# Patient Record
Sex: Female | Born: 1990 | Race: White | Hispanic: No | Marital: Single | Smoking: Never smoker
Health system: Southern US, Community
[De-identification: ages and names within clinical notes are randomized; demographics above are authoritative.]

## PROBLEM LIST (undated history)

## (undated) DIAGNOSIS — F419 Anxiety disorder, unspecified: Secondary | ICD-10-CM

## (undated) HISTORY — PX: PELVIS CLOSED REDUCTION: SHX1023

---

## 2013-07-31 ENCOUNTER — Ambulatory Visit: Payer: Self-pay | Admitting: Physician Assistant

## 2014-07-10 ENCOUNTER — Encounter: Payer: Self-pay | Admitting: Emergency Medicine

## 2014-07-10 ENCOUNTER — Ambulatory Visit
Admission: EM | Admit: 2014-07-10 | Discharge: 2014-07-10 | Disposition: A | Discharge status: Home / Self Care | Payer: BLUE CROSS/BLUE SHIELD | Attending: Family Medicine | Admitting: Family Medicine

## 2014-07-10 DIAGNOSIS — J029 Acute pharyngitis, unspecified: Secondary | ICD-10-CM | POA: Diagnosis present

## 2014-07-10 DIAGNOSIS — J020 Streptococcal pharyngitis: Secondary | ICD-10-CM | POA: Diagnosis not present

## 2014-07-10 HISTORY — DX: Anxiety disorder, unspecified: F41.9

## 2014-07-10 LAB — RAPID STREP SCREEN (MED CTR MEBANE ONLY): Streptococcus, Group A Screen (Direct): POSITIVE — AB

## 2014-07-10 MED ORDER — AMOXICILLIN 500 MG PO CAPS: 500.0000 mg | ORAL_CAPSULE | Freq: Three times a day (TID) | ORAL | Status: DC

## 2014-07-10 NOTE — ED Notes (Signed)
Sore throat  For 5 days

## 2014-07-10 NOTE — ED Provider Notes (Signed)
Patient presents today with symptoms of sore throat for the last 5 days. She denies any high fever, abdominal pain, severe headache, rash, chest pain, shortness of breath, nasal congestion, cough. She admits her last menstrual period being about 2 weeks ago.  ROS: Negative except mentioned above. Vitals as per Epic.  GENERAL: NAD HEENT: moderate pharyngeal erythema, no exudate, no erythema of TMs, no cervical LAD RESP: CTA B CARD: RRR NEURO: CN II-XII groslly intact   A/P: Strep pharyngitis-rapid strep was positive in the office today, will treat with amoxicillin, encourage patient is a Tylenol and/or Motrin for pain/fever, if symptoms do persist or worsen patient is to seek medical attention. Discussed using condoms if sexually active with men while on antibiotics. Discouraged patient from working today and tomorrow.   Jolene ProvostKirtida Kiyara Bouffard, MD 07/10/14 1149

## 2015-06-24 ENCOUNTER — Ambulatory Visit
Admission: EM | Admit: 2015-06-24 | Discharge: 2015-06-24 | Disposition: A | Discharge status: Home / Self Care | Payer: BC Managed Care – PPO | Attending: Family Medicine | Admitting: Family Medicine

## 2015-06-24 ENCOUNTER — Encounter: Payer: Self-pay | Admitting: *Deleted

## 2015-06-24 DIAGNOSIS — S61219A Laceration without foreign body of unspecified finger without damage to nail, initial encounter: Secondary | ICD-10-CM

## 2015-06-24 MED ORDER — NAPROXEN 500 MG PO TABS: 500.0000 mg | ORAL_TABLET | Freq: Two times a day (BID) | ORAL | Status: DC

## 2015-06-24 MED ORDER — TETANUS-DIPHTH-ACELL PERTUSSIS 5-2.5-18.5 LF-MCG/0.5 IM SUSP
0.5000 mL | Freq: Once | INTRAMUSCULAR | Status: AC
  Administered 2015-06-24: 0.5 mL via INTRAMUSCULAR

## 2015-06-24 MED ORDER — SULFAMETHOXAZOLE-TRIMETHOPRIM 800-160 MG PO TABS: 1.0000 | ORAL_TABLET | Freq: Two times a day (BID) | ORAL | Status: DC

## 2015-06-24 MED ORDER — TETANUS-DIPHTH-ACELL PERTUSSIS 5-2.5-18.5 LF-MCG/0.5 IM SUSP: 0.5000 mL | Freq: Once | INTRAMUSCULAR | Status: DC

## 2015-06-24 MED ORDER — MUPIROCIN 2 % EX OINT: 1.0000 "application " | TOPICAL_OINTMENT | Freq: Three times a day (TID) | CUTANEOUS | Status: DC

## 2015-06-24 NOTE — ED Provider Notes (Signed)
CSN: [id number]     Arrival date & time 06/24/15  1414 History   First MD Initiated Contact with Patient 06/24/15 1523     Chief Complaint  Patient presents with  . Extremity Laceration   (Consider location/radiation/quality/duration/timing/severity/associated sxs/prior Treatment) HPI  25 year old female whoWas assisting a friend repairing a motorcycle 4 days ago when she inadvertently cut the tip of her left index finger on a sharp piece of metal. She did not seek medical attention and has instead been applying antibiotic ointment at first and then keeping it dry with skin liquid bandage. He worked as a Leisure centre manager and has been attempting to keep it dry using fingercots and the skin adhesive. Despite this the wound has been symptomatic with discomfort swelling and some redness. The laceration is approximately 5 millimeters at the radial distal tip not involving the fingernail. She's not been running fever or chills and has had no significant drainage from the wound.      Past Medical History  Diagnosis Date  . Anxiety    Past Surgical History  Procedure Laterality Date  . Pelvis closed reduction     Family History  Problem Relation Age of Onset  . Rheum arthritis Neg Hx   . Osteoarthritis Neg Hx   . Asthma Neg Hx   . Cancer Neg Hx   . Diabetes Neg Hx   . Heart failure Neg Hx   . Hyperlipidemia Neg Hx   . Hypertension Neg Hx   . Migraines Neg Hx   . Rashes / Skin problems Neg Hx   . Seizures Neg Hx   . Stroke Neg Hx   . Thyroid disease Neg Hx    Social History  Substance Use Topics  . Smoking status: Never Smoker   . Smokeless tobacco: Never Used  . Alcohol Use: No   OB History    No data available     Review of Systems  Constitutional: Positive for activity change. Negative for fever, chills and fatigue.  Skin: Positive for wound.  All other systems reviewed and are negative.   Allergies  Review of patient's allergies indicates no known allergies.  Home  Medications   Prior to Admission medications   Medication Sig Start Date End Date Taking? Authorizing Provider  amoxicillin (AMOXIL) 500 MG capsule Take 1 capsule (500 mg total) by mouth 3 (three) times daily. 07/10/14   Jolene Provost, MD  escitalopram (LEXAPRO) 10 MG tablet Take 10 mg by mouth daily.    Historical Provider, MD  mupirocin ointment (BACTROBAN) 2 % Apply 1 application topically 3 (three) times daily. 06/24/15   Lutricia Feil, PA-C  naproxen (NAPROSYN) 500 MG tablet Take 1 tablet (500 mg total) by mouth 2 (two) times daily with a meal. 06/24/15   Lutricia Feil, PA-C  sulfamethoxazole-trimethoprim (BACTRIM DS,SEPTRA DS) 800-160 MG tablet Take 1 tablet by mouth 2 (two) times daily. 06/24/15   Lutricia Feil, PA-C   Meds Ordered and Administered this Visit   Medications  Tdap (BOOSTRIX) injection 0.5 mL (0.5 mLs Intramuscular Given 06/24/15 1543)    BP 106/69 mmHg  Pulse 66  Temp(Src) 98.2 F (36.8 C) (Oral)  Resp 18  Ht  (1.727 m)  Wt 110 lb (49.896 kg)  BMI 16.73 kg/m2  SpO2 100%  LMP 06/15/2015 No data found.   Physical Exam  Constitutional: She is oriented to person, place, and time. She appears well-developed and well-nourished. No distress.  HENT:  Head: Normocephalic and atraumatic.  Eyes:  Conjunctivae are normal. Pupils are equal, round, and reactive to light.  Neck: Normal range of motion. Neck supple.  Musculoskeletal: She exhibits edema and tenderness.  Examination of the left nondominant hand of the index finger shows a half centimeter laceration with dried blood over the radial aspect of the distal tip not involving the fingernail. FDS and FDP are strong and good pull-through. There is mild hip esthesia at the tip. There is no current drainage.  Neurological: She is alert and oriented to person, place, and time.  Skin: Skin is warm and dry. She is not diaphoretic.  Psychiatric: She has a normal mood and affect. Her behavior is normal. Judgment and  thought content normal.  Nursing note and vitals reviewed.   ED Course  Procedures (including critical care time)  Labs Review Labs Reviewed - No data to display  Imaging Review No results found.   Visual Acuity Review  Right Eye Distance:   Left Eye Distance:   Bilateral Distance:    Right Eye Near:   Left Eye Near:    Bilateral Near:      Medications  Tdap (BOOSTRIX) injection 0.5 mL (0.5 mLs Intramuscular Given 06/24/15 1543)     MDM   1. Laceration of finger with delay in treatment, initial encounter    New Prescriptions   MUPIROCIN OINTMENT (BACTROBAN) 2 %    Apply 1 application topically 3 (three) times daily.   NAPROXEN (NAPROSYN) 500 MG TABLET    Take 1 tablet (500 mg total) by mouth 2 (two) times daily with a meal.   SULFAMETHOXAZOLE-TRIMETHOPRIM (BACTRIM DS,SEPTRA DS) 800-160 MG TABLET    Take 1 tablet by mouth 2 (two) times daily.  Plan: 1. Test/x-ray results and diagnosis reviewed with patient 2. rx as per orders; risks, benefits, potential side effects reviewed with patient 3. Recommend supportive treatment with Keeping it clean and dry. 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 She should not do serial the wound now with new skin that Take Pl., Bactroban on the wound 3 times a day with a washing program. I reviewed with her signs and symptoms of infection and should these occur she'll return immediately. Placed her on empiricSeptra DS for 5 days and given her Naprosyn for pain as necessary. 4. F/u prn if symptoms worsen or don't improve     Lutricia FeilWilliam P Roemer, PA-C 06/24/15 1549

## 2015-06-24 NOTE — ED Notes (Signed)
Patient was assisting friend with working on their motorcycle 4 days ago when she cut the tip of her left index finger. Patient has applied antibiotic ointment and liquid bandage.

## 2015-06-24 NOTE — Discharge Instructions (Signed)
Nonsutured Laceration Care °A laceration is a cut that goes through all layers of the skin and extends into the tissue that is right under the skin. This type of cut is usually stitched up (sutured) or closed with tape (adhesive strips) or skin glue shortly after the injury happens. °However, if the wound is dirty or if several hours pass before medical treatment is provided, it is likely that germs (bacteria) will enter the wound. Closing a laceration after bacteria have entered it increases the risk of infection. In these cases, your health care provider may leave the laceration open (nonsutured) and cover it with a bandage. This type of treatment helps prevent infection and allows the wound to heal from the deepest layer of tissue damage up to the surface. °An open fracture is a type of injury that may involve nonsutured lacerations. An open fracture is a break in a bone that happens along with one or more lacerations through the skin that is near the fracture site. °HOW TO CARE FOR YOUR NONSUTURED LACERATION °· Take or apply over-the-counter and prescription medicines only as told by your health care provider. °· If you were prescribed an antibiotic medicine, take or apply it as told by your health care provider. Do not stop using the antibiotic even if your condition improves. °· Clean the wound one time each day or as told by your health care provider. °¨ Wash the wound with mild soap and water. °¨ Rinse the wound with water to remove all soap. °¨ Pat your wound dry with a clean towel. Do not rub the wound. °· Do not inject anything into the wound unless your health care provider told you to. °· Change any bandages (dressings) as told by your health care provider. This includes changing the dressing if it gets wet, dirty, or starts to smell bad. °· Keep the dressing dry until your health care provider says it can be removed. Do not take baths, swim, or do anything that puts your wound underwater until your  health care provider approves. °· Raise (elevate) the injured area above the level of your heart while you are sitting or lying down, if possible. °· Do not scratch or pick at the wound. °· Check your wound every day for signs of infection. Watch for: °¨ Redness, swelling, or pain. °¨ Fluid, blood, or pus. °· Keep all follow-up visits as told by your health care provider. This is important. °SEEK MEDICAL CARE IF: °· You received a tetanus and shot and you have swelling, severe pain, redness, or bleeding at the injection site.   °· You have a fever. °· Your pain is not controlled with medicine. °· You have increased redness, swelling, or pain at the site of your wound. °· You have fluid, blood, or pus coming from your wound. °· You notice a bad smell coming from your wound or your dressing. °· You notice something coming out of the wound, such as wood or glass. °· You notice a change in the color of your skin near your wound. °· You develop a new rash. °· You need to change the dressing frequently due to fluid, blood, or pus draining from the wound. °· You develop numbness around your wound. °SEEK IMMEDIATE MEDICAL CARE IF: °· Your pain suddenly increases and is severe. °· You develop severe swelling around the wound. °· The wound is on your hand or foot and you cannot properly move a finger or toe. °· The wound is on your hand or   foot and you notice that your fingers or toes look pale or bluish. °· You have a red streak going away from your wound. °  °This information is not intended to replace advice given to you by your health care provider. Make sure you discuss any questions you have with your health care provider. °  °Document Released: 11/21/2005 Document Revised: 05/10/2014 Document Reviewed: 12/20/2013 °[employer]vier Interactive Patient Education ©2016 [employer] ° °Wound Care °Taking care of your wound properly can help to prevent pain and infection. It can also help your wound to heal more quickly.  °HOW TO  CARE FOR YOUR WOUND  °· Take or apply over-the-counter and prescription medicines only as told by your health care provider. °· If you were prescribed antibiotic medicine, take or apply it as told by your health care provider. Do not stop using the antibiotic even if your condition improves. °· Clean the wound each day or as told by your health care provider. °¨ Wash the wound with mild soap and water. °¨ Rinse the wound with water to remove all soap. °¨ Pat the wound dry with a clean towel. Do not rub it. °· There are many different ways to close and cover a wound. For example, a wound can be covered with stitches (sutures), skin glue, or adhesive strips. Follow instructions from your health care provider about: °¨ How to take care of your wound. °¨ When and how you should change your bandage (dressing). °¨ When you should remove your dressing. °¨ Removing whatever was used to close your wound. °· Check your wound every day for signs of infection. Watch for: °¨ Redness, swelling, or pain. °¨ Fluid, blood, or pus. °· Keep the dressing dry until your health care provider says it can be removed. Do not take baths, swim, use a hot tub, or do anything that would put your wound underwater until your health care provider approves. °· Raise (elevate) the injured area above the level of your heart while you are sitting or lying down. °· Do not scratch or pick at the wound. °· Keep all follow-up visits as told by your health care provider. This is important. °SEEK MEDICAL CARE IF: °· You received a tetanus shot and you have swelling, severe pain, redness, or bleeding at the injection site. °· You have a fever. °· Your pain is not controlled with medicine. °· You have increased redness, swelling, or pain at the site of your wound. °· You have fluid, blood, or pus coming from your wound. °· You notice a bad smell coming from your wound or your dressing. °SEEK IMMEDIATE MEDICAL CARE IF: °· You have a red streak going away from  your wound. °  °This information is not intended to replace advice given to you by your health care provider. Make sure you discuss any questions you have with your health care provider. °  °Document Released: 10/03/2007 Document Revised: 05/10/2014 Document Reviewed: 12/20/2013 °[employer] Interactive Patient Education ©2016 [employer] ° °

## 2015-08-27 ENCOUNTER — Ambulatory Visit
Admission: EM | Admit: 2015-08-27 | Discharge: 2015-08-27 | Disposition: A | Discharge status: Home / Self Care | Payer: BC Managed Care – PPO | Attending: Family Medicine | Admitting: Family Medicine

## 2015-08-27 DIAGNOSIS — H6593 Unspecified nonsuppurative otitis media, bilateral: Secondary | ICD-10-CM

## 2015-08-27 DIAGNOSIS — J329 Chronic sinusitis, unspecified: Secondary | ICD-10-CM | POA: Insufficient documentation

## 2015-08-27 DIAGNOSIS — J029 Acute pharyngitis, unspecified: Secondary | ICD-10-CM

## 2015-08-27 DIAGNOSIS — J019 Acute sinusitis, unspecified: Secondary | ICD-10-CM | POA: Diagnosis not present

## 2015-08-27 LAB — RAPID STREP SCREEN (MED CTR MEBANE ONLY): Streptococcus, Group A Screen (Direct): NEGATIVE

## 2015-08-27 MED ORDER — CETIRIZINE HCL 10 MG PO CAPS: 10.0000 mg | ORAL_CAPSULE | Freq: Every day | ORAL | Status: DC

## 2015-08-27 MED ORDER — ACETAMINOPHEN 500 MG PO TABS: 1000.0000 mg | ORAL_TABLET | Freq: Four times a day (QID) | ORAL | 0 refills | Status: DC | PRN

## 2015-08-27 MED ORDER — AMOXICILLIN-POT CLAVULANATE 875-125 MG PO TABS: 1.0000 | ORAL_TABLET | Freq: Two times a day (BID) | ORAL | 0 refills | Status: AC

## 2015-08-27 MED ORDER — SALINE SPRAY 0.65 % NA SOLN: 2.0000 | NASAL | 0 refills | Status: DC

## 2015-08-27 MED ORDER — PSEUDOEPHEDRINE HCL 30 MG PO TABS: 30.0000 mg | ORAL_TABLET | Freq: Four times a day (QID) | ORAL | 0 refills | Status: AC | PRN

## 2015-08-27 MED ORDER — IBUPROFEN 800 MG PO TABS: 800.0000 mg | ORAL_TABLET | Freq: Three times a day (TID) | ORAL | 0 refills | Status: DC

## 2015-08-27 MED ORDER — FLUTICASONE PROPIONATE 50 MCG/ACT NA SUSP: 1.0000 | Freq: Two times a day (BID) | NASAL | 0 refills | Status: DC

## 2015-08-27 MED ORDER — FIRST-DUKES MOUTHWASH MT SUSP
30.0000 mL | Freq: Three times a day (TID) | OROMUCOSAL | 0 refills | Status: AC | PRN
Start: 2015-08-27 — End: 2015-09-03

## 2015-08-27 MED ORDER — ONDANSETRON 8 MG PO TBDP: 8.0000 mg | ORAL_TABLET | Freq: Two times a day (BID) | ORAL | 0 refills | Status: DC | PRN

## 2015-08-27 NOTE — ED Provider Notes (Signed)
CSN: [id number]     Arrival date & time 08/27/15  1343 History   First MD Initiated Contact with Patient 08/27/15 1357     Chief Complaint  Patient presents with  . Sore Throat   (Consider location/radiation/quality/duration/timing/severity/associated sxs/prior Treatment) Single caucasian female established patient to this clinic here for evaluation sore throat x 3 days, post nasal drip, vomiting/nausea yesterday, headache, nasal congestion, hoarse voice.  Works in [employer] needs work excuse.  Decreased po intake due to pain.  Has tried benadryl and sudafed without much improvement in symptoms and motrin/tylenol for pain.  Has had 5 cases of strep throat in the previous year per patient  PMHx glasses, seasonal allergies  PSHx pelvis fracture      Past Medical History:  Diagnosis Date  . Anxiety    Past Surgical History:  Procedure Laterality Date  . PELVIS CLOSED REDUCTION     Family History  Problem Relation Age of Onset  . Rheum arthritis Neg Hx   . Osteoarthritis Neg Hx   . Asthma Neg Hx   . Cancer Neg Hx   . Diabetes Neg Hx   . Heart failure Neg Hx   . Hyperlipidemia Neg Hx   . Hypertension Neg Hx   . Migraines Neg Hx   . Rashes / Skin problems Neg Hx   . Seizures Neg Hx   . Stroke Neg Hx   . Thyroid disease Neg Hx    Social History  Substance Use Topics  . Smoking status: Never Smoker  . Smokeless tobacco: Never Used  . Alcohol use No   OB History    No data available     Review of Systems  Constitutional: Positive for activity change, appetite change, chills and fatigue. Negative for diaphoresis, fever and unexpected weight change.  HENT: Positive for congestion, ear pain, postnasal drip, rhinorrhea, sinus pressure, sore throat and voice change. Negative for dental problem, drooling, ear discharge, facial swelling, hearing loss, mouth sores, nosebleeds, sneezing, tinnitus and trouble swallowing.   Eyes: Negative for photophobia, pain, discharge, redness,  itching and visual disturbance.  Respiratory: Negative for cough, choking, chest tightness, shortness of breath, wheezing and stridor.   Cardiovascular: Negative for chest pain, palpitations and leg swelling.  Gastrointestinal: Positive for nausea and vomiting. Negative for abdominal distention, abdominal pain, blood in stool, constipation and diarrhea.  Endocrine: Negative for cold intolerance and heat intolerance.  Genitourinary: Negative for difficulty urinating, dysuria and hematuria.  Musculoskeletal: Negative for arthralgias, back pain, gait problem, joint swelling, myalgias, neck pain and neck stiffness.  Skin: Negative for color change, pallor, rash and wound.  Allergic/Immunologic: Positive for environmental allergies. Negative for food allergies.  Neurological: Positive for headaches. Negative for dizziness, tremors, seizures, syncope, facial asymmetry, speech difficulty, weakness, light-headedness and numbness.  Hematological: Negative for adenopathy. Does not bruise/bleed easily.  Psychiatric/Behavioral: Negative for agitation, behavioral problems, confusion and sleep disturbance.    Allergies  Review of patient's allergies indicates no known allergies.  Home Medications   Prior to Admission medications   Medication Sig Start Date End Date Taking? Authorizing Provider  acetaminophen (TYLENOL) 500 MG tablet Take 2 tablets (1,000 mg total) by mouth every 6 (six) hours as needed. 08/27/15   Barbaraann Barthelina A Dayzee Trower, NP  amoxicillin-clavulanate (AUGMENTIN) 875-125 MG tablet Take 1 tablet by mouth every 12 (twelve) hours. 08/27/15 09/06/15  Barbaraann Barthelina A Hagen Bohorquez, NP  Cetirizine HCl 10 MG CAPS Take 1 capsule (10 mg total) by mouth daily. If not using Dukes mouthwash 08/27/15 09/26/15  Barbaraann Barthel, NP  Diphenhyd-Hydrocort-Nystatin (FIRST-DUKES MOUTHWASH) SUSP Use as directed 30 mLs in the mouth or throat 3 (three) times daily as needed. 08/27/15 09/03/15  Barbaraann Barthel, NP  escitalopram  (LEXAPRO) 10 MG tablet Take 10 mg by mouth daily.    Historical Provider, MD  fluticasone (FLONASE) 50 MCG/ACT nasal spray Place 1 spray into both nostrils 2 (two) times daily. 08/27/15   Barbaraann Barthel, NP  ibuprofen (ADVIL,MOTRIN) 800 MG tablet Take 1 tablet (800 mg total) by mouth 3 (three) times daily. 08/27/15   Barbaraann Barthel, NP  ondansetron (ZOFRAN ODT) 8 MG disintegrating tablet Take 1 tablet (8 mg total) by mouth 2 (two) times daily as needed for nausea or vomiting. 08/27/15   Barbaraann Barthel, NP  pseudoephedrine (SUDAFED) 30 MG tablet Take 1 tablet (30 mg total) by mouth every 6 (six) hours as needed for congestion (max 240mg  per 24 hours avoid alcohol). 08/27/15 09/03/15  Barbaraann Barthel, NP  sodium chloride (OCEAN) 0.65 % SOLN nasal spray Place 2 sprays into both nostrils every 2 (two) hours while awake. 08/27/15 09/26/15  Barbaraann Barthel, NP   Meds Ordered and Administered this Visit  Medications - No data to display  BP 119/69 (BP Location: Left Arm)   Pulse 63   Temp 97.7 F (36.5 C) (Tympanic)   Resp 16   Ht 5\' 9"  (1.753 m)   Wt 110 lb (49.9 kg)   LMP 08/21/2015   SpO2 100%   BMI 16.24 kg/m  No data found.   Physical Exam  Constitutional: She is oriented to person, place, and time. She appears well-developed and well-nourished. She is active and cooperative.  Non-toxic appearance. She does not have a sickly appearance. She appears ill. No distress.  HENT:  Head: Normocephalic and atraumatic.  Right Ear: Hearing, external ear and ear canal normal. A middle ear effusion is present.  Left Ear: Hearing, external ear and ear canal normal. A middle ear effusion is present.  Nose: Mucosal edema and rhinorrhea present. No nose lacerations, sinus tenderness, nasal deformity, septal deviation or nasal septal hematoma. No epistaxis.  No foreign bodies. Right sinus exhibits no maxillary sinus tenderness and no frontal sinus tenderness. Left sinus exhibits no maxillary sinus  tenderness and no frontal sinus tenderness.  Mouth/Throat: Uvula is midline and mucous membranes are normal. Mucous membranes are not pale, not dry and not cyanotic. She does not have dentures. No oral lesions. No trismus in the jaw. Normal dentition. No dental abscesses, uvula swelling, lacerations or dental caries. Posterior oropharyngeal edema and posterior oropharyngeal erythema present. No oropharyngeal exudate or tonsillar abscesses. Tonsils are 1+ on the right. Tonsils are 1+ on the left. No tonsillar exudate.  Oropharynx with macular erythema; cobblestoning posterior pharynx; hoarse voice; tonsils 1+/4 bilaterally edema/erythema; bilateral allergic shiners; bilateral nasal turbinates with edema/erythema/clear discharge; bilateral TMs with air fluid level clear  Eyes: Conjunctivae, EOM and lids are normal. Pupils are equal, round, and reactive to light. Right eye exhibits no chemosis, no discharge, no exudate and no hordeolum. No foreign body present in the right eye. Left eye exhibits no chemosis, no discharge, no exudate and no hordeolum. No foreign body present in the left eye. Right conjunctiva is not injected. Right conjunctiva has no hemorrhage. Left conjunctiva is not injected. Left conjunctiva has no hemorrhage. No scleral icterus. Right eye exhibits normal extraocular motion and no nystagmus. Left eye exhibits normal extraocular motion and no nystagmus. Right pupil is round and  reactive. Left pupil is round and reactive. Pupils are equal.  Neck: Trachea normal and normal range of motion. Neck supple. No tracheal tenderness, no spinous process tenderness and no muscular tenderness present. No neck rigidity. No tracheal deviation, no edema, no erythema and normal range of motion present. No thyroid mass and no thyromegaly present.  Cardiovascular: Normal rate, regular rhythm, S1 normal, S2 normal, normal heart sounds and intact distal pulses.  PMI is not displaced.  Exam reveals no gallop and no  friction rub.   No murmur heard. Pulmonary/Chest: Effort normal and breath sounds normal. No accessory muscle usage or stridor. No respiratory distress. She has no decreased breath sounds. She has no wheezes. She has no rhonchi. She has no rales. She exhibits no tenderness.  Abdominal: Soft. Normal appearance. She exhibits no distension, no fluid wave and no ascites. There is no rigidity and no guarding. No hernia. Hernia confirmed negative in the ventral area.  Musculoskeletal: Normal range of motion. She exhibits no edema or tenderness.       Right shoulder: Normal.       Left shoulder: Normal.       Right hip: Normal.       Left hip: Normal.       Right knee: Normal.       Left knee: Normal.       Cervical back: Normal.       Right hand: Normal.       Left hand: Normal.  Lymphadenopathy:       Head (right side): No submental, no submandibular, no tonsillar, no preauricular, no posterior auricular and no occipital adenopathy present.       Head (left side): No submental, no submandibular, no tonsillar, no preauricular, no posterior auricular and no occipital adenopathy present.    She has no cervical adenopathy.       Right cervical: No superficial cervical, no deep cervical and no posterior cervical adenopathy present.      Left cervical: No superficial cervical, no deep cervical and no posterior cervical adenopathy present.  Neurological: She is alert and oriented to person, place, and time. She has normal strength. She is not disoriented. She displays no atrophy and no tremor. No cranial nerve deficit or sensory deficit. She exhibits normal muscle tone. She displays no seizure activity. Coordination and gait normal. GCS eye subscore is 4. GCS verbal subscore is 5. GCS motor subscore is 6.  Skin: Skin is warm, dry and intact. Capillary refill takes less than 2 seconds. No abrasion, no bruising, no burn, no ecchymosis, no laceration, no lesion, no petechiae and no rash noted. She is not  diaphoretic. No cyanosis or erythema. No pallor. Nails show no clubbing.  Psychiatric: She has a normal mood and affect. Her speech is normal and behavior is normal. Judgment and thought content normal. Cognition and memory are normal.  Nursing note and vitals reviewed.   Urgent Care Course   Clinical Course    Procedures (including critical care time)  Labs Review Labs Reviewed  RAPID STREP SCREEN (NOT AT [facility])  CULTURE, GROUP A STREP [facility])    Imaging Review No results found.   Rapid strep pending 1405  1415 tolerated popsicle without difficulty.  Patient notified rapid strep negative will call with throat culture results once available typically 48-72 hours.  Patient verbalized understanding of information/instructions, agreed  With plan of care and had no further questions at this time. MDM   1. Pharyngitis   2. Otitis media  with effusion, bilateral   3. Acute rhinosinusitis    Patient may use normal saline nasal spray as needed. Zyrtec 10mg  po daily if not using duke's mouthwash TID.  Consider antihistamine or nasal steroid use flonase 1 spray each nostril BID  Avoid triggers if possible.  Shower prior to bedtime if exposed to triggers.  If allergic dust/dust mites recommend mattress/pillow covers/encasements; washing linens, vacuuming, sweeping, dusting weekly.  Call or return to clinic as needed if these symptoms worsen or fail to improve as anticipated.   Exitcare handout on allergic rhinitis given to patient.  Patient verbalized understanding of instructions, agreed with plan of care and had no further questions at this time.  P2:  Avoidance and hand washing.  Supportive treatment.   No evidence of invasive bacterial infection, non toxic and well hydrated.  This is most likely self limiting viral infection.  I do not see where any further testing or imaging is necessary at this time.   I will suggest supportive care, rest, good hygiene and encourage the patient to take  adequate fluids.  The patient is to return to clinic or EMERGENCY ROOM if symptoms worsen or change significantly e.g. ear pain, fever, purulent discharge from ears or bleeding.  Exitcare handout on otitis media with effusion given to patient.  Patient verbalized agreement and understanding of treatment plan.    zofran 8mg  po BID prn nausea/vomiting #6 RF0 Rx given.  Tylenol 1000mg  po QID prn pain/fever and/or motrin 800mg  po TID prn pain/fever.  School/work excuse note given to patient for 24 hours.  Usually no specific medical treatment is needed if a virus is causing the sore throat.  The throat most often gets better on its own within 5 to 7 days.  Antibiotic medicine does not cure viral pharyngitis.   Dukes mouthwash 30ml gargle and swallow TID prn throat pain.  Rx given for augmentin 875mg  po BID x 10 days if throat culture positive.  For acute pharyngitis caused by bacteria, your healthcare provider will prescribe an antibiotic.  Marland Kitchen. Do not smoke.  Marland Kitchen. Avoid secondhand smoke and other air pollutants.  . Use a cool mist humidifier to add moisture to the air.  . Get plenty of rest.  . You may want to rest your throat by talking less and eating a diet that is mostly liquid or soft for a day or two.   Marland Kitchen. Nonprescription throat lozenges and mouthwashes should help relieve the soreness.   . Gargling with warm saltwater and drinking warm liquids may help.  (You can make a saltwater solution by adding 1/4 teaspoon of salt to 8 ounces, or 240 mL, of warm water.)  . A nonprescription pain reliever such as aspirin, acetaminophen, or ibuprofen may ease general aches and pains.   FOLLOW UP with clinic provider if no improvements in the next 7-10 days.  Patient verbalized understanding of instructions and agreed with plan of care. P2:  Hand washing and diet.    Barbaraann Barthelina A Homer Pfeifer, NP 08/27/15 [phone]

## 2015-08-27 NOTE — ED Triage Notes (Signed)
Patient states that in the last year she has had strep 5 times. Patient states that today she has a sore throat, fever, chills, and painful swallowing. Patient states that symptoms this time started around 2 weeks.

## 2015-08-29 LAB — CULTURE, GROUP A STREP (THRC)

## 2015-08-31 ENCOUNTER — Telehealth: Payer: Self-pay | Admitting: Family Medicine

## 2015-08-31 NOTE — Telephone Encounter (Signed)
Telephone message left for patient throat culture did grow out strep and to ensure she started augmentin 875mg  po BID x 10 days.  Requested patient contact clinic to verify she received message and if any further questions at this time.

## 2015-09-06 NOTE — Telephone Encounter (Signed)
See phone/lab note from 8/24... [facility]

## 2017-05-27 ENCOUNTER — Ambulatory Visit
Admission: EM | Admit: 2017-05-27 | Discharge: 2017-05-27 | Disposition: A | Discharge status: Home / Self Care | Payer: BC Managed Care – PPO | Attending: Family Medicine | Admitting: Family Medicine

## 2017-05-27 DIAGNOSIS — Z3201 Encounter for pregnancy test, result positive: Secondary | ICD-10-CM | POA: Diagnosis not present

## 2017-05-27 DIAGNOSIS — O219 Vomiting of pregnancy, unspecified: Secondary | ICD-10-CM

## 2017-05-27 DIAGNOSIS — Z331 Pregnant state, incidental: Secondary | ICD-10-CM

## 2017-05-27 DIAGNOSIS — R112 Nausea with vomiting, unspecified: Secondary | ICD-10-CM

## 2017-05-27 DIAGNOSIS — R55 Syncope and collapse: Secondary | ICD-10-CM

## 2017-05-27 LAB — COMPREHENSIVE METABOLIC PANEL
ALT: 17 U/L (ref 14–54)
ANION GAP: 12 (ref 5–15)
AST: 20 U/L (ref 15–41)
Albumin: 4.9 g/dL (ref 3.5–5.0)
Alkaline Phosphatase: 46 U/L (ref 38–126)
BUN: 6 mg/dL (ref 6–20)
CALCIUM: 9.3 mg/dL (ref 8.9–10.3)
CO2: 21 mmol/L — ABNORMAL LOW (ref 22–32)
CREATININE: 0.5 mg/dL (ref 0.44–1.00)
Chloride: 102 mmol/L (ref 101–111)
GFR calc non Af Amer: >60 mL/min (ref >60)
Glucose, Bld: 90 mg/dL (ref 65–99)
Potassium: 3.4 mmol/L — ABNORMAL LOW (ref 3.5–5.1)
Sodium: 135 mmol/L (ref 135–145)
TOTAL PROTEIN: 7.6 g/dL (ref 6.5–8.1)
Total Bilirubin: 1 mg/dL (ref 0.3–1.2)

## 2017-05-27 LAB — CBC WITH DIFFERENTIAL/PLATELET
Basophils Absolute: 0 10*3/uL (ref 0–0.1)
Basophils Relative: 0 %
Eosinophils Absolute: 0.1 10*3/uL (ref 0–0.7)
Eosinophils Relative: 1 %
HEMATOCRIT: 43.3 % (ref 35.0–47.0)
HEMOGLOBIN: 14.7 g/dL (ref 12.0–16.0)
LYMPHS ABS: 2.7 10*3/uL (ref 1.0–3.6)
Lymphocytes Relative: 23 %
MCH: 28.7 pg (ref 26.0–34.0)
MCHC: 34 g/dL (ref 32.0–36.0)
MCV: 84.4 fL (ref 80.0–100.0)
Monocytes Absolute: 0.6 10*3/uL (ref 0.2–0.9)
Monocytes Relative: 5 %
NEUTROS ABS: 8 10*3/uL — AB (ref 1.4–6.5)
NEUTROS PCT: 71 %
Platelets: 232 10*3/uL (ref 150–440)
RBC: 5.13 MIL/uL (ref 3.80–5.20)
RDW: 12.3 % (ref 11.5–14.5)
WBC: 11.3 10*3/uL — ABNORMAL HIGH (ref 3.6–11.0)

## 2017-05-27 LAB — HCG, QUANTITATIVE, PREGNANCY: hCG, Beta Chain, Quant, S: 119436 m[IU]/mL — ABNORMAL HIGH (ref <5)

## 2017-05-27 MED ORDER — PRENATAL 28-0.8 MG PO TABS
1.0000 | ORAL_TABLET | Freq: Every day | ORAL | 3 refills | Status: DC
Start: 2017-05-27 — End: 2018-06-23

## 2017-05-27 MED ORDER — ONDANSETRON HCL 4 MG/2ML IJ SOLN
4.0000 mg | Freq: Once | INTRAMUSCULAR | Status: AC
  Administered 2017-05-27: 4 mg via INTRAVENOUS

## 2017-05-27 MED ORDER — SODIUM CHLORIDE 0.9 % IV BOLUS
1000.0000 mL | Freq: Once | INTRAVENOUS | Status: AC
  Administered 2017-05-27: 1000 mL via INTRAVENOUS

## 2017-05-27 MED ORDER — DOXYLAMINE-PYRIDOXINE 10-10 MG PO TBEC
DELAYED_RELEASE_TABLET | ORAL | 1 refills | Status: DC
Start: 2017-05-27 — End: 2018-06-23

## 2017-05-27 NOTE — ED Triage Notes (Signed)
Reports syncopal episode today at work around 1330.   "hasn't been able to eat since Friday".  Reports N/V for "weeks".   Had miscarriage about 1.5 months ago.  Took pregnancy today and is showed positive.   Denies any vag bleed. No menstrual cycle.   Laying:  BP - 114/85 1715  HR - 79  Sitting:  BP - 119/85 1717  HR - 93  Standing: BP - 118/90 1719  HR - 108

## 2017-05-27 NOTE — Discharge Instructions (Signed)
Rest, fluids.  Medication as prescribed.  Call for an appt at OB-GYN.  Take care  Dr. Adriana Simas

## 2017-05-27 NOTE — ED Provider Notes (Signed)
[facility]    CSN: [id number] Arrival date & time: 05/27/17  1640  History   Chief Complaint Chief Complaint  Patient presents with  . Nausea   HPI   27 year old female presents for evaluation of persistent nausea and vomiting as well as a syncopal episode earlier today.  Patient states that her last mental period was in the middle of March.  She subsequently had a positive pregnancy test at home.  She states that at the end of April she had vaginal bleeding and thought that she had a miscarriage.  During this period of time she continued to have nausea and vomiting.  This has continued to persist.  Today she had a syncopal episode at work after having persistent nausea and vomiting.  She took a pregnancy test today and it was positive.  She said no further vaginal bleeding since  April.  She states that she is unable to keep much down.  She did eat some Cheerios today but vomited up as well.  She has not seen an OB/GYN.  She is not on a prenatal vitamin.  She has no other complaints or concerns at this time.  Past Medical History:  Diagnosis Date  . Anxiety    Surgical Hx: pelvic repair      FRACTURE SURGERY   pelvis   COLONOSCOPY W/BIOPSY 01/17/2016 N/A Procedure: Colonoscopy/consent signed; Surgeon: Little Ishikawa, MD; Location: [facility]; Service: Gastroenterology; Laterality: N/A;     OB History   None    Home Medications    Prior to Admission medications   Medication Sig Start Date End Date Taking? Authorizing Provider  Doxylamine-Pyridoxine 10-10 MG TBEC Two tablets at bedtime on day 1 and 2; if symptoms persist, take 1 tablet in morning and 2 tablets at bedtime on day 3; if symptoms persist, may increase to 1 tablet in morning, 1 tablet mid-afternoon, and 2 tablets at bedtime on day 4 05/27/17   Khamari Sheehan, Dorie Rank G, DO  PRENATAL 28-0.8 MG TABS Take 1 tablet by mouth daily. 05/27/17   Tommie Sams, DO   Family History Colon polyps Cousin      Colon polyps Father    Diabetes type II Father    Diabetes type II Maternal Grandfather    Stroke Maternal Grandfather    Breast cancer Maternal Grandmother    Diabetes type II Maternal Grandmother    High blood pressure (Hypertension) Maternal Grandmother    Liver cancer Mother    Cancer Paternal Grandfather    Diabetes type II Paternal Grandfather    Myocardial Infarction (Heart attack) Paternal Grandfather    Colon polyps Paternal Uncle     Social History Social History   Tobacco Use  . Smoking status: Never Smoker  . Smokeless tobacco: Never Used  Substance Use Topics  . Alcohol use: No  . Drug use: No   Allergies   Patient has no known allergies.   Review of Systems Review of Systems  Constitutional: Positive for appetite change.  Gastrointestinal: Positive for nausea and vomiting.  Neurological: Positive for syncope.   Physical Exam Triage Vital Signs ED Triage Vitals  Enc Vitals Group     BP 05/27/17 1707 117/87     Pulse Rate 05/27/17 1707 98     Resp 05/27/17 1707 16     Temp 05/27/17 1707 98.7 F (37.1 C)     Temp Source 05/27/17 1707 Oral     SpO2 05/27/17 1707 100 %     Weight --  Height --      Head Circumference --      Peak Flow --      Pain Score 05/27/17 1708 0     Pain Loc --      Pain Edu? --      Excl. in GC? --   Updated Vital Signs BP 122/82 (BP Location: Left Arm)   Pulse 87   Temp 98.7 F (37.1 C) (Oral)   Resp 16   LMP 03/26/2017 (Within Weeks)   SpO2 100%  Physical Exam  Constitutional: She is oriented to person, place, and time. She appears well-developed. No distress.  HENT:  Dry mucous membranes.  Cardiovascular: Normal rate and regular rhythm.  Pulmonary/Chest: Effort normal and breath sounds normal. She has no wheezes. She has no rales.  Abdominal: Soft. She exhibits no distension. There is no tenderness.  Neurological: She is alert and oriented to person, place, and time.  Psychiatric:  She has a normal mood and affect. Her behavior is normal.  Nursing note and vitals reviewed.  UC Treatments / Results  Labs (all labs ordered are listed, but only abnormal results are displayed) Labs Reviewed  CBC WITH DIFFERENTIAL/PLATELET - Abnormal; Notable for the following components:      Result Value   WBC 11.3 (*)    Neutro Abs 8.0 (*)    All other components within normal limits  COMPREHENSIVE METABOLIC PANEL - Abnormal; Notable for the following components:   Potassium 3.4 (*)    CO2 21 (*)    All other components within normal limits  HCG, QUANTITATIVE, PREGNANCY - Abnormal; Notable for the following components:   hCG, Beta Chain, Quant, S [id number] (*)    All other components within normal limits    EKG None  Radiology No results found.  Procedures Procedures (including critical care time)  Medications Ordered in UC Medications  sodium chloride 0.9 % bolus 1,000 mL (0 mLs Intravenous Stopped 05/27/17 1918)  ondansetron (ZOFRAN) injection 4 mg (4 mg Intravenous Given 05/27/17 1809)    Initial Impression / Assessment and Plan / UC Course  I have reviewed the triage vital signs and the nursing notes.  Pertinent labs & imaging results that were available during my care of the patient were reviewed by me and considered in my medical decision making (see chart for details).    27 year old female presents with nausea, vomiting, and a syncopal episode.  Labs confirm pregnancy.  Statics negative.  Her nausea and vomiting is from pregnancy.  Patient had vasovagal syncope today from persistent nausea vomiting.  Labs revealed mild hypokalemia.  IV fluids given today.  Zofran given with improvement in nausea.  We discussed the risks of Zofran and pregnancy.  She was amenable to this.  I advised her that she needs to follow-up with OB/GYN.  I placed on a prenatal vitamin.  Diclegis given for nausea & vomiting of pregnancy.  Final Clinical Impressions(s) / UC Diagnoses   Final  diagnoses:  Nausea and vomiting in pregnancy  Vasovagal syncope     Discharge Instructions     Rest, fluids.  Medication as prescribed.  Call for an appt at OB-GYN.  Take care  Dr. Adriana Simas    ED Prescriptions    Medication Sig Dispense Auth. Provider   PRENATAL 28-0.8 MG TABS Take 1 tablet by mouth daily. 90 each Ramyah Pankowski G, DO   Doxylamine-Pyridoxine 10-10 MG TBEC Two tablets at bedtime on day 1 and 2; if symptoms persist, take 1 tablet  in morning and 2 tablets at bedtime on day 3; if symptoms persist, may increase to 1 tablet in morning, 1 tablet mid-afternoon, and 2 tablets at bedtime on day 4 90 tablet Tommie Sams, DO     Controlled Substance Prescriptions [place] Controlled Substance Registry consulted? Not Applicable   Tommie Sams, DO 05/27/17 1958

## 2018-06-23 ENCOUNTER — Ambulatory Visit
Admission: EM | Admit: 2018-06-23 | Discharge: 2018-06-23 | Disposition: A | Discharge status: Home / Self Care | Payer: BC Managed Care – PPO | Attending: Urgent Care | Admitting: Urgent Care

## 2018-06-23 ENCOUNTER — Other Ambulatory Visit: Payer: Self-pay

## 2018-06-23 DIAGNOSIS — H66006 Acute suppurative otitis media without spontaneous rupture of ear drum, recurrent, bilateral: Secondary | ICD-10-CM

## 2018-06-23 MED ORDER — FLUCONAZOLE 150 MG PO TABS: ORAL_TABLET | ORAL | 0 refills | Status: AC

## 2018-06-23 MED ORDER — AMOXICILLIN-POT CLAVULANATE ER 1000-62.5 MG PO TB12: 2.0000 | ORAL_TABLET | Freq: Two times a day (BID) | ORAL | 0 refills | Status: AC

## 2018-06-23 NOTE — Discharge Instructions (Signed)
It was very nice seeing you today in clinic. Thank you for entrusting me with your care.   Please utilize the medications that we discussed. Your prescriptions have been called in to your pharmacy. Increase fluid intake as much as possible. Water is always best, as sugar and caffeine containing fluids can cause you to become dehydrated. Try to incorporate electrolyte enriched fluids, such as Gatorade or Pedialyte, into your daily fluid intake. May use Tylenol and/or Ibuprofen as needed for pain/fever. Continue allergy medications.   Make arrangements to follow up with ENT in 1 week for re-evaluation if not improving. I have provided you the name and office contact information for an excellent local provider. If your symptoms/condition worsens, please seek follow up care either here or in the ER. Please remember, our [place] providers are "right here with you" when you need [place].   Again, it was my pleasure to take care of you today. Thank you for choosing our clinic. I hope that you start to feel better quickly.   Honor Loh, MSN, APRN, FNP-C, CEN Advanced Practice Provider [facility]

## 2018-06-23 NOTE — ED Triage Notes (Signed)
Patient complains of ear ache that she has been dealing with since 4 weeks ago. States that she recently took Amoxiciilin and prednisone.

## 2018-06-23 NOTE — ED Provider Notes (Signed)
[address] [place], [place] [address] [phone]   Name: Crystal Carey DOB: September 26, 1990 MRN: [id number] CSN: [id number] PCP: Ricardo Jericho, NP  Arrival date and time:  06/23/18 1746  Chief Complaint:  Otalgia  NOTE: Prior to seeing the patient today, I have reviewed the triage nursing documentation and vital signs. Clinical staff has updated patient's PMH/PSHx, current medication list, and drug allergies/intolerances to ensure comprehensive history available to assist in medical decision making.   History:   HPI: Crystal Carey is a 28 y.o. female who presents today with complaints of BILATERAL ear pain x 4 weeks.  Patient denies any fevers or chills.  Patient complains of decreased hearing in both of her ears.  She was recently seen and evaluated at [facility] urgent care and diagnosed with eustachian tube dysfunction.  Patient prescribed a 10-day course of amoxicillin and a prednisone taper.  Patient completed amoxicillin course 4 days ago, and the prednisone a week ago. Patient notes initially that symptoms improved, however they have returned "full force" again, with the majority of her pain being in her RIGHT ear.  She has not experienced any otorrhea.  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  Recurrent ear infections have carried over into patient's adult life.  Patient denies recent respiratory illness; no cough or forceful nose blowing.  In efforts to help treat her recurrent symptoms conservatively at home, patient has been using Allegra-D and pseudoephedrine, which have done little to improve her symptoms.  Patient has increased her fluid intake as previously directed.  Past Medical History:  Diagnosis Date   Anxiety     Past Surgical History:  Procedure Laterality Date   PELVIS CLOSED REDUCTION      Family History  Problem Relation Age of Onset   Rheum arthritis Neg Hx     Osteoarthritis Neg Hx    Asthma Neg Hx    Cancer Neg Hx    Diabetes Neg Hx    Heart failure Neg Hx    Hyperlipidemia Neg Hx    Hypertension Neg Hx    Migraines Neg Hx    Rashes / Skin problems Neg Hx    Seizures Neg Hx    Stroke Neg Hx    Thyroid disease Neg Hx     Social History   Socioeconomic History   Marital status: Single    Spouse name: Not on file   Number of children: Not on file   Years of education: Not on file   Highest education level: Not on file  Occupational History   Not on file  Social Needs   Financial resource strain: Not on file   Food insecurity    Worry: Not on file    Inability: Not on file   Transportation needs    Medical: Not on file    Non-medical: Not on file  Tobacco Use   Smoking status: Never Smoker   Smokeless tobacco: Never Used  Substance and Sexual Activity   Alcohol use: No   Drug use: No   Sexual activity: Not on file  Lifestyle   Physical activity    Days per week: Not on file    Minutes per session: Not on file   Stress: Not on file  Relationships   Social connections    Talks on phone: Not on file    Gets together: Not on file    Attends religious service: Not on file    Active member  of club or organization: Not on file    Attends meetings of clubs or organizations: Not on file    Relationship status: Not on file   Intimate partner violence    Fear of current or ex partner: Not on file    Emotionally abused: Not on file    Physically abused: Not on file    Forced sexual activity: Not on file  Other Topics Concern   Not on file  Social History Narrative   Not on file    There are no active problems to display for this patient.   Home Medications:    No outpatient medications have been marked as taking for the 06/23/18 encounter [facility](Hospital Encounter).    Allergies:   Patient has no known allergies.  Review of Systems (ROS): Review of Systems  Constitutional: Positive for  fatigue. Negative for chills and fever.  HENT: Positive for ear pain (BILATERAL (R>L); hearing muffled). Negative for congestion, ear discharge, rhinorrhea, sinus pressure, sinus pain, sneezing, sore throat, tinnitus and trouble swallowing.   Eyes: Negative for pain, redness and itching.  Respiratory: Negative for cough and shortness of breath.   Cardiovascular: Negative for chest pain and palpitations.  Gastrointestinal: Negative for diarrhea, nausea and vomiting.  Neurological: Negative for dizziness, syncope, weakness and headaches.  Hematological: Positive for adenopathy.     Physical Exam:  Triage Vital Signs ED Triage Vitals [06/23/18 1801]  Enc Vitals Group     BP      Pulse      Resp      Temp      Temp src      SpO2      Weight 130 lb (59 kg)     Height 5\' 8"  (1.727 m)     Head Circumference      Peak Flow      Pain Score 8     Pain Loc      Pain Edu?      Excl. in GC?     Physical Exam  Constitutional: She is oriented to person, place, and time and well-developed, well-nourished, and in no distress.  HENT:  Head: Normocephalic and atraumatic.  Right Ear: There is tenderness. No drainage. No foreign bodies. Tympanic membrane is erythematous and bulging. A middle ear effusion is present. Decreased hearing is noted.  Left Ear: There is tenderness. No drainage. No foreign bodies. Tympanic membrane is not erythematous and not bulging. A middle ear effusion is present. Decreased hearing is noted.  Mouth/Throat: Oropharynx is clear and moist and mucous membranes are normal.  Eyes: Pupils are equal, round, and reactive to light. EOM are normal.  Neck: Normal range of motion. Neck supple. No tracheal deviation present.  Cardiovascular: Normal rate, regular rhythm, normal heart sounds and intact distal pulses. Exam reveals no gallop and no friction rub.  No murmur heard. Pulmonary/Chest: Effort normal and breath sounds normal. No respiratory distress. She has no wheezes. She  has no rales.  Neurological: She is alert and oriented to person, place, and time.  Skin: Skin is warm and dry. No rash noted. No erythema.  Psychiatric: Mood, affect and judgment normal.  Nursing note and vitals reviewed.    Urgent Care Treatments / Results:   LABS: PLEASE NOTE: all labs that were ordered this encounter are listed, however only abnormal results are displayed. Labs Reviewed - No data to display  EKG: -None  RADIOLOGY: No results found.  PROCEDURES: Procedures  MEDICATIONS RECEIVED THIS VISIT: Medications -  No data to display  PERTINENT CLINICAL COURSE NOTES/UPDATES:   Initial Impression / Assessment and Plan / Urgent Care Course:    Crystal Carey is a 28 y.o. female who [facility] with complaints of Otalgia  Pertinent labs & imaging results that were available during my care of the patient were personally reviewed by me and considered in my medical decision making (see lab/imaging section of note for values and interpretations).  Patient overall well appearing and in no acute distress today in clinic. Exam reveals an erythematous bulging TM on the RIGHT, and fluid behind the TM on the LEFT.  There are no perforations noted.  There is no associated otorrhea.  Treatment guidelines reviewed for recurrent suppurative otitis media.  In light of the fact the patient failed initial treatment with amoxicillin, will treat with high-dose Augmentin (2000/125 mg) twice daily x 10 days.  Encouraged patient to add active culture yogurt to daily diet while on antibiotics to prevent both oral and vaginal candidiasis.  Will send in prescription for preventative Diflucan for patient to have on hand as needed (2 doses).  Discussed with patient that if she is not improving in the next week, further evaluation will be required.  She was provided with the name and contact information for local otolaryngologist Elenore Rota(Juengel, MD).   I have reviewed the  follow up and strict return precautions for any new or worsening symptoms. Patient is aware of symptoms that would be deemed urgent/emergent, and would thus require further evaluation either here or in the emergency department. At the time of discharge, she verbalized understanding and consent with the discharge plan as it was reviewed with her. All questions were fielded by provider and/or clinic staff prior to patient discharge.    Final Clinical Impressions(s) / Urgent Care Diagnoses:   Final diagnoses:  Recurrent acute suppurative otitis media without spontaneous rupture of tympanic membrane of both sides    New Prescriptions:   Meds ordered this encounter  Medications   amoxicillin-clavulanate (AUGMENTIN XR) 1000-62.5 MG 12 hr tablet    Sig: Take 2 tablets by mouth 2 (two) times daily.    Dispense:  40 tablet    Refill:  0   fluconazole (DIFLUCAN) 150 MG tablet    Sig: Take 1 tab (150 mg) x 1 dose. If not improved in 3 days, may repeat dose.    Dispense:  2 tablet    Refill:  0    Controlled Substance Prescriptions:  [place] Controlled Substance Registry consulted? Not Applicable  NOTE: This note was prepared using Dragon dictation software along with smaller phrase technology. Despite my best ability to proofread, there is the potential that transcriptional errors may still occur from this process, and are completely unintentional.     Verlee MonteGray, Yousra Ivens E, NP 06/23/18 2006

## 2018-07-02 ENCOUNTER — Other Ambulatory Visit: Payer: Self-pay | Admitting: Physician Assistant

## 2018-07-02 DIAGNOSIS — H9209 Otalgia, unspecified ear: Secondary | ICD-10-CM

## 2018-07-07 ENCOUNTER — Other Ambulatory Visit: Payer: Self-pay

## 2018-07-07 ENCOUNTER — Ambulatory Visit
Admission: RE | Admit: 2018-07-07 | Discharge: 2018-07-07 | Disposition: A | Discharge status: Home / Self Care | Payer: BC Managed Care – PPO | Source: Ambulatory Visit | Attending: Physician Assistant | Admitting: Physician Assistant

## 2018-07-07 DIAGNOSIS — H9209 Otalgia, unspecified ear: Secondary | ICD-10-CM | POA: Insufficient documentation

## 2018-07-07 MED ORDER — IOHEXOL 300 MG/ML  SOLN
75.0000 mL | Freq: Once | INTRAMUSCULAR | Status: AC | PRN
  Administered 2018-07-07: 75 mL via INTRAVENOUS

## 2019-07-31 IMAGING — CT CT NECK WITH CONTRAST
2 of 3 series · 8 of 14 positions shown, 10 images · IV contrast (omnipaque)
Comparison: None.

CLINICAL DATA: Right ear pain and right ear infections over the
last month.

EXAM:
CT NECK WITH CONTRAST
TECHNIQUE: Multidetector CT imaging of the neck was performed using the
standard protocol following the bolus administration of intravenous
contrast.
CONTRAST:  75mL OMNIPAQUE IOHEXOL 300 MG/ML  SOLN

[Series 2: axial neck · axial · 0.53mm/px · z∈[-535,-421]mm · 3 of 115 slices shown]
[im 29/115  bone]
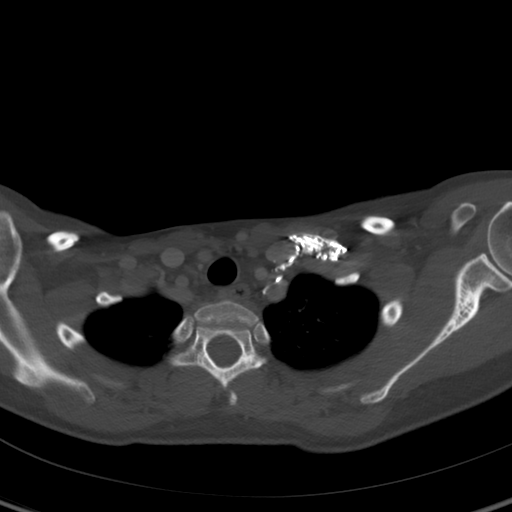
[im 58/115  bone]
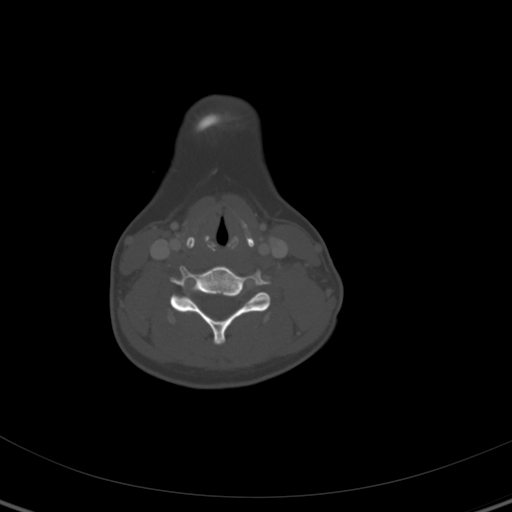
[im 86/115  bone]
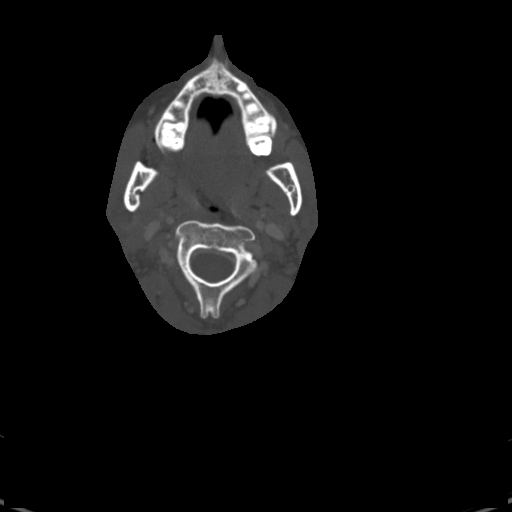

[Series 7: orthogonal ax · axial · 0.34mm/px · z∈[-597,-412]mm · 5 of 143 slices shown, 7 images]
[im 24/143  soft-tissue]
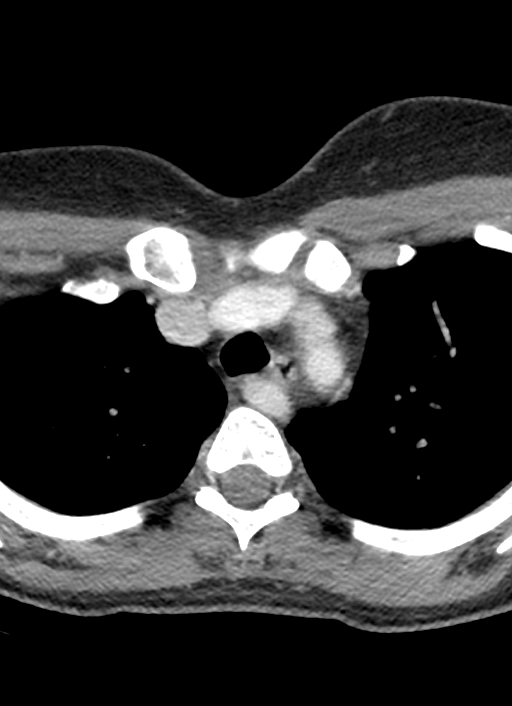
[im 24/143  bone]
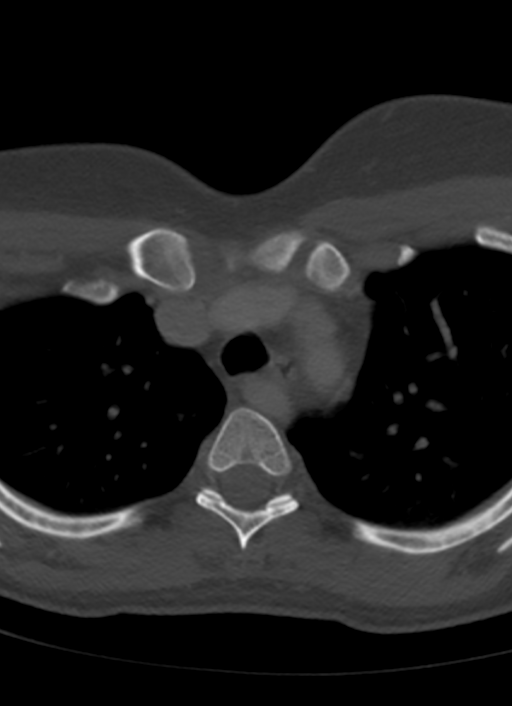
[im 48/143  bone]
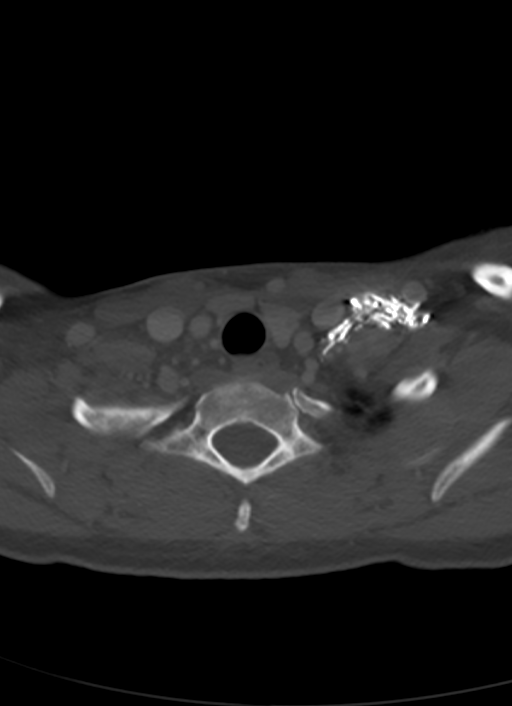
[im 72/143  bone]
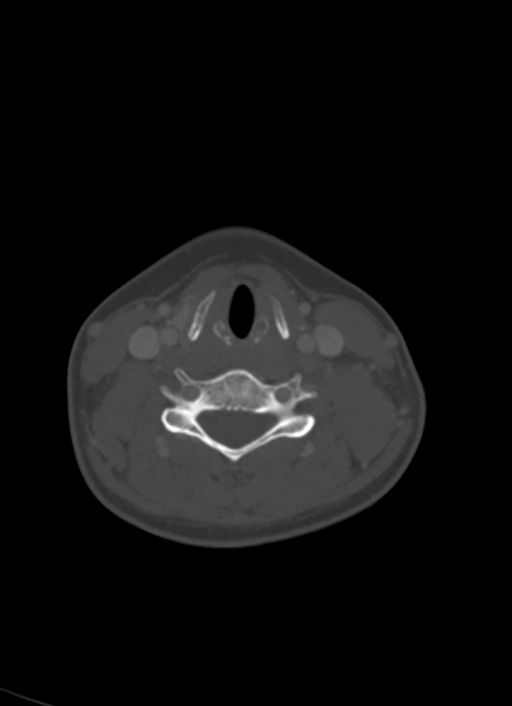
[im 95/143  bone]
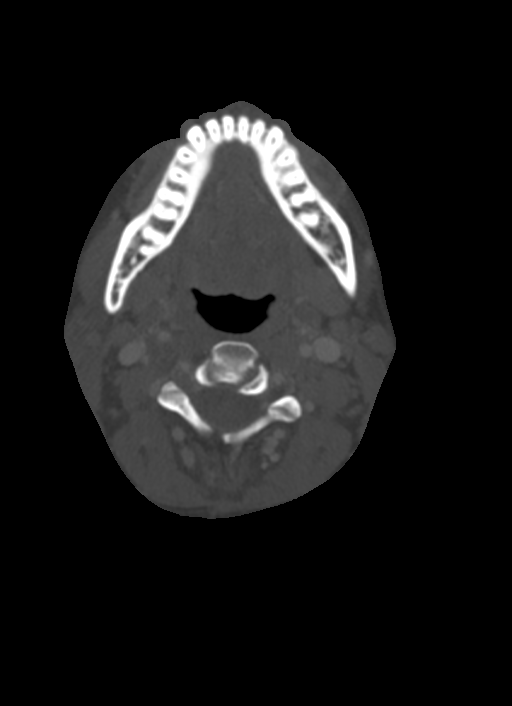
[im 119/143  soft-tissue]
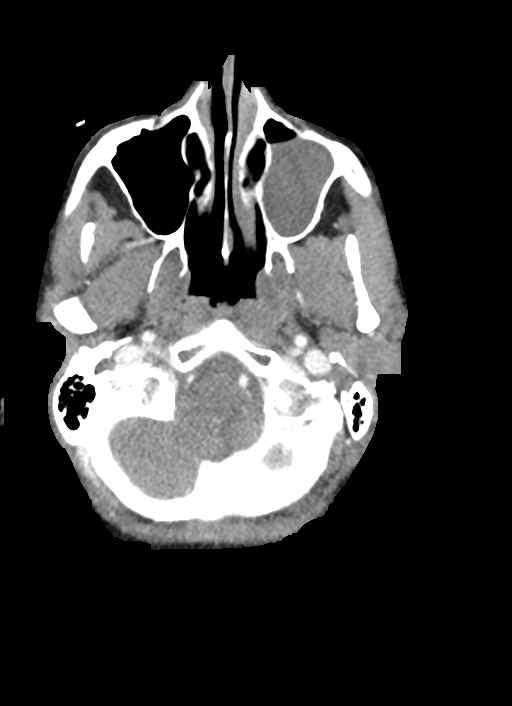
[im 119/143  bone]
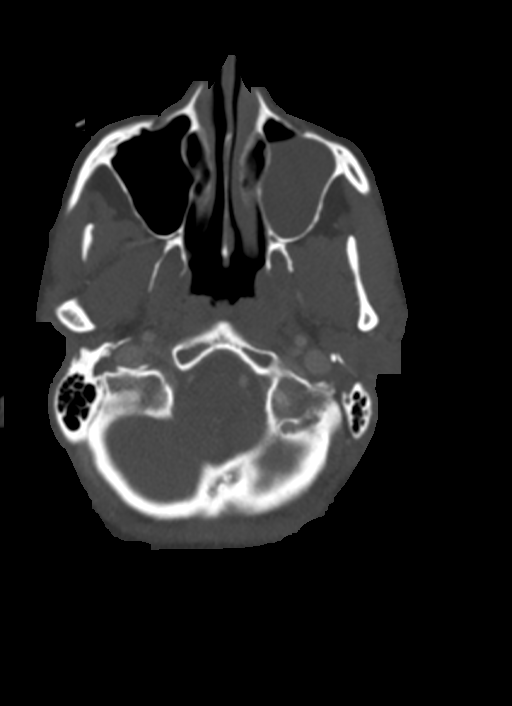

[8 of 14 positions shown; findings below may reference images not displayed]

FINDINGS: Pharynx and larynx: Normal

Salivary glands: Parotid and submandibular glands are normal.

Thyroid: Normal

Lymph nodes: No enlarged or low-density nodes on either side of the
neck. Normal bilateral regional nodes.

Vascular: Arterial and venous structures appear normal in the neck.
Patient does have an anomalous origin of the right subclavian artery
as the last branch vessel of the arch. I do not see how this would
relate to the clinical presentation.

Limited intracranial: Normal

Visualized orbits: Normal

Mastoids and visualized paranasal sinuses: Visualized mastoid air
cells appear clear on both sides. I do not see any fluid in either
middle ear. Inner ear structures appear normal as seen. No likely
significant sinus disease. Large simple appearing retention cyst in
the left maxillary sinus, possibly secondary to a dental root
incursion. Opacified posterior ethmoid air cell on the right.

Skeleton: Otherwise normal. Specific attention the right
temporomandibular joint does not show any abnormality. No acute
dental pathology is seen.

Upper chest: Abnormal dense material in the left supraclavicular
region looks like either contrast in some sort of vascular
malformation or soft tissue calcification. The appearance is unusual
and not primarily or completely evaluated.

Other: None
IMPRESSION: No abnormality seen to explain right ear pain. The entire right
temporal bone is not included, the very upper portion of the attic
being excluded from the imaging volume. However, we do see most of
the right temporal bone and there is no evidence of any fluid in the
mastoid air cells or middle ear.

No significant paranasal sinus disease. Large retention cyst in the
right maxillary sinus.

Anomalous origin of the right subclavian artery as the last vessel
from the arch, creating a vascular ring. I do not think this would
relate to the clinical presentation.

Abnormal density tissue in the left supraclavicular region. This is
favored to represent some sort of venous malformation, probably a
hemangioma, filled with dense contrast from the injection. This
could possibly represent a densely calcified mass, but I think this
is most likely dense venous contrast.
